# Patient Record
Sex: Female | Born: 1995 | Race: White | Hispanic: No | Marital: Single | Smoking: Never smoker
Health system: Southern US, Community
[De-identification: ages and names within clinical notes are randomized; demographics above are authoritative.]

---
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
## 2016-01-17 IMAGING — CR DG HAND COMPLETE 3+V*R*
3 series · 3 of 3 positions shown · non-contrast
Comparison: None.

CLINICAL DATA: Punched wall with posterior hand pain.

EXAM:
RIGHT HAND - COMPLETE 3+ VIEW

[x hand pa right]
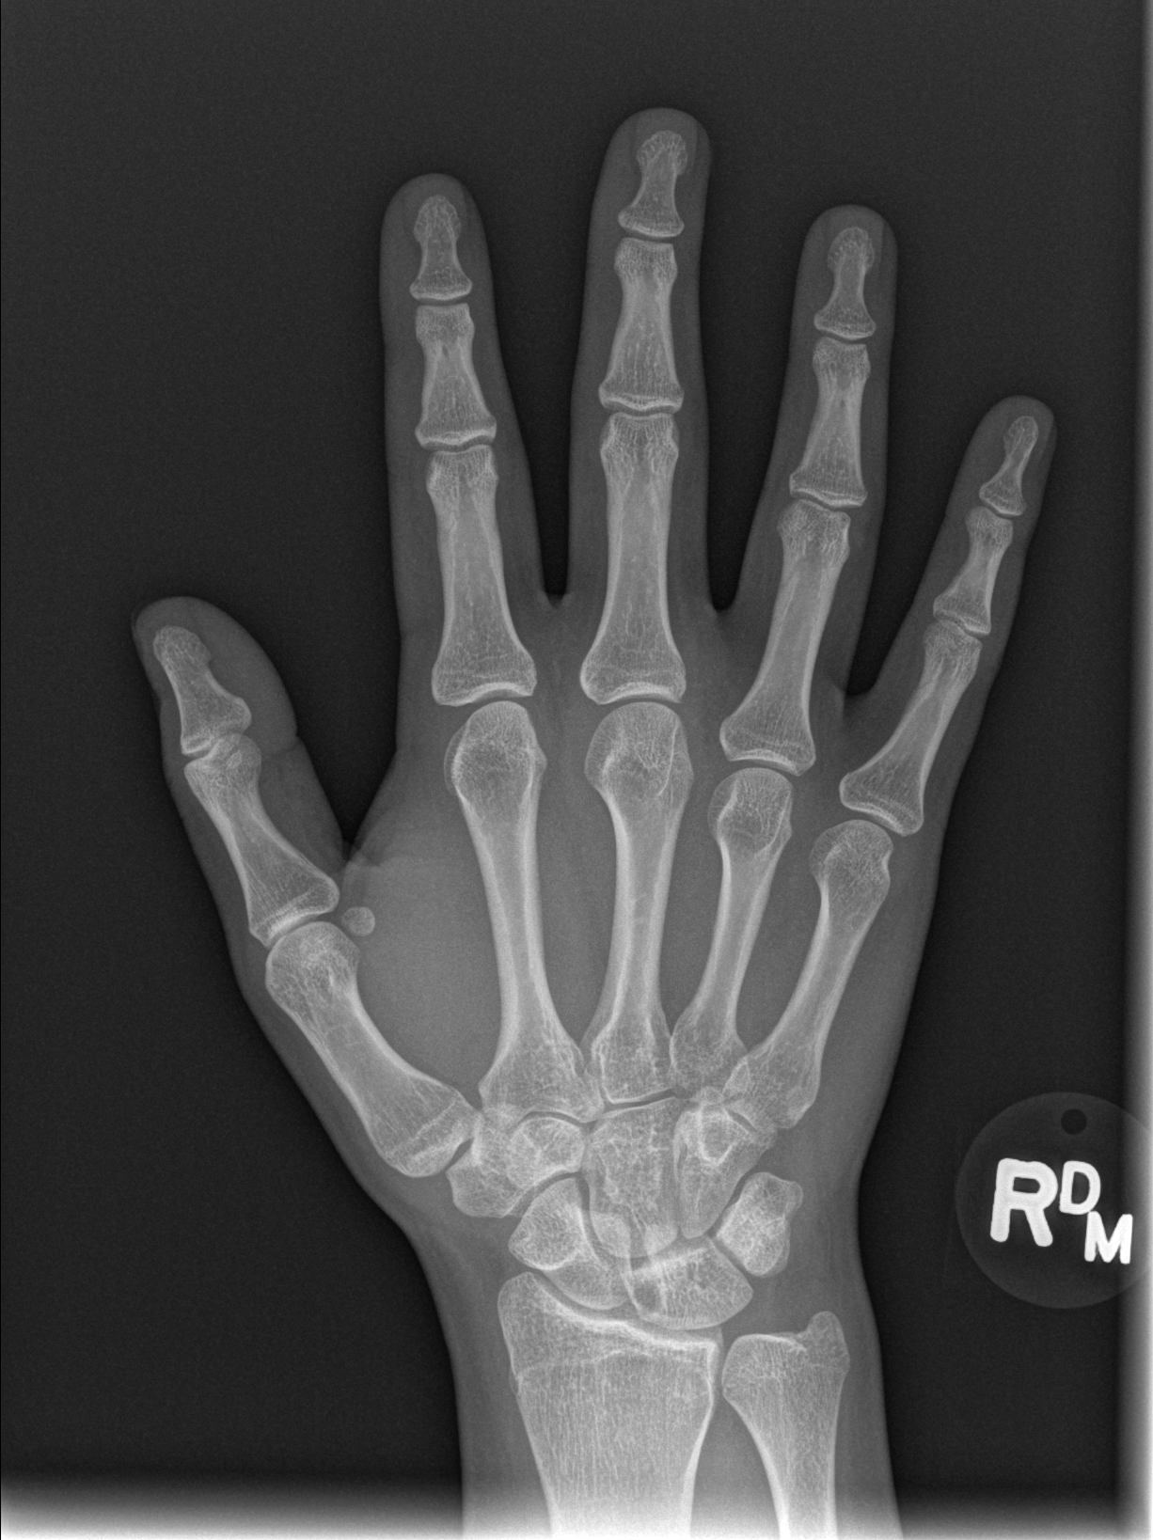

[x hand oblique right]
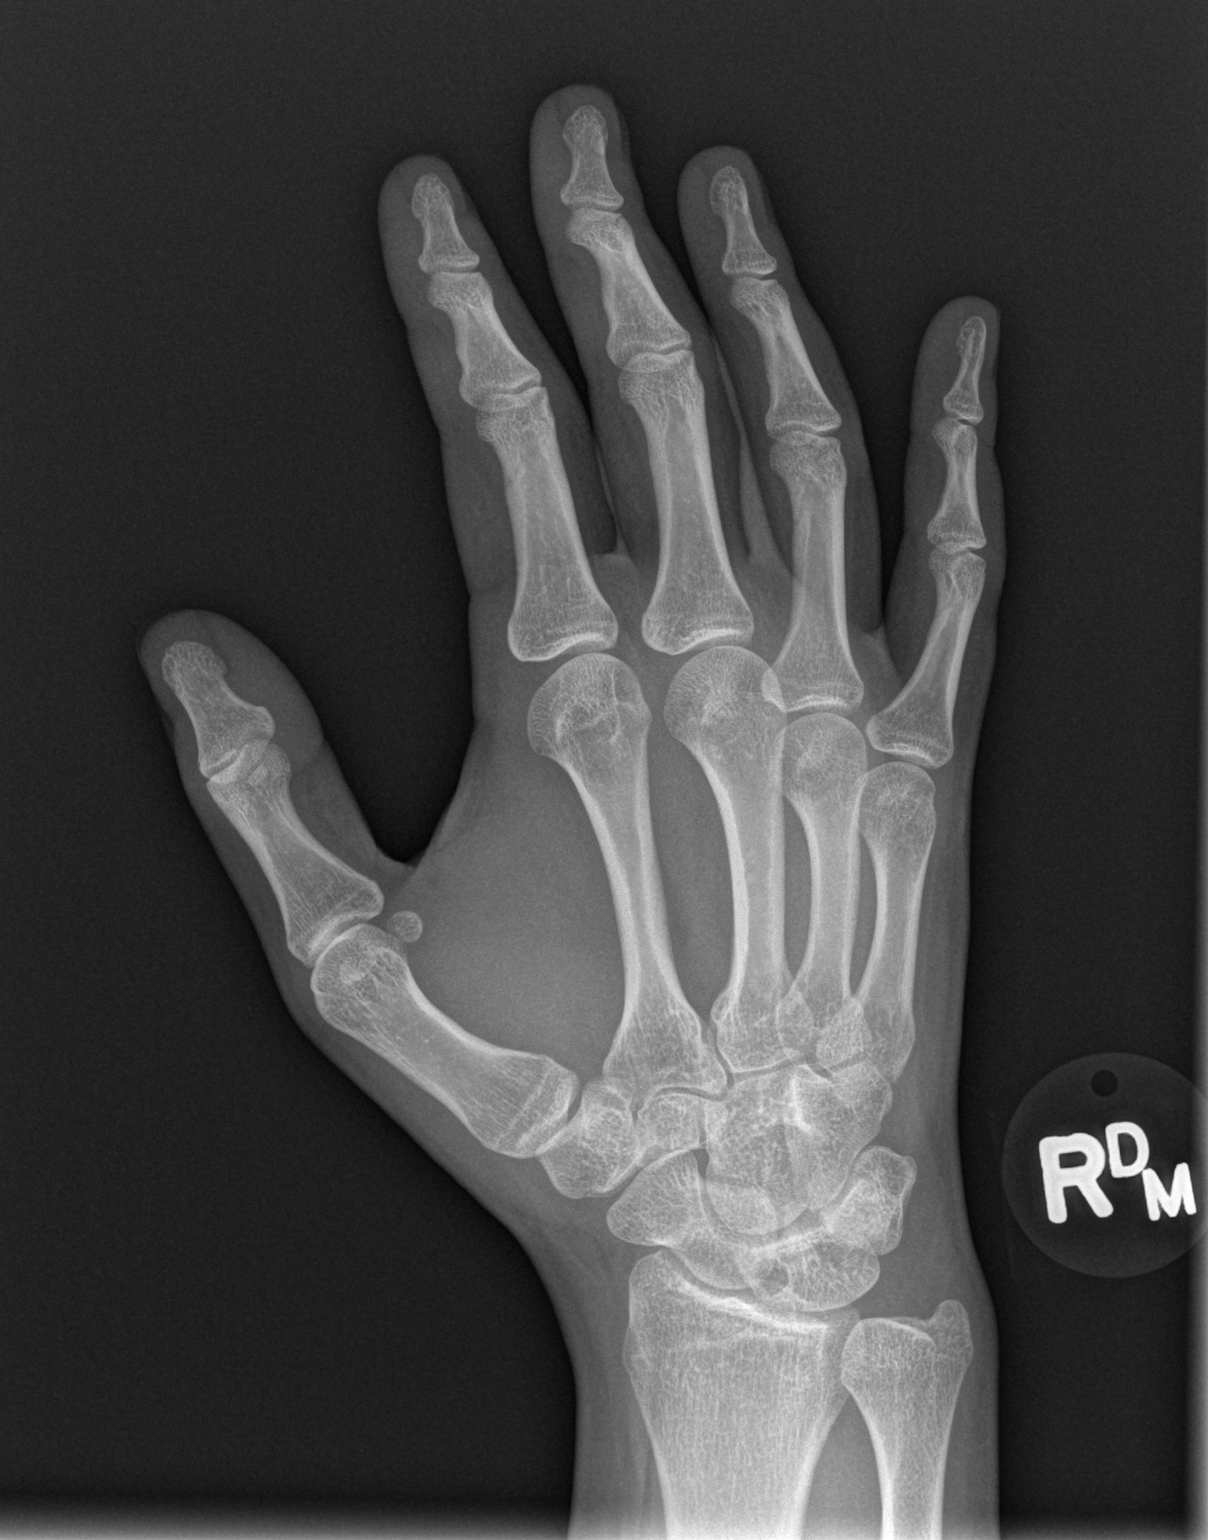

[x hand lat right]
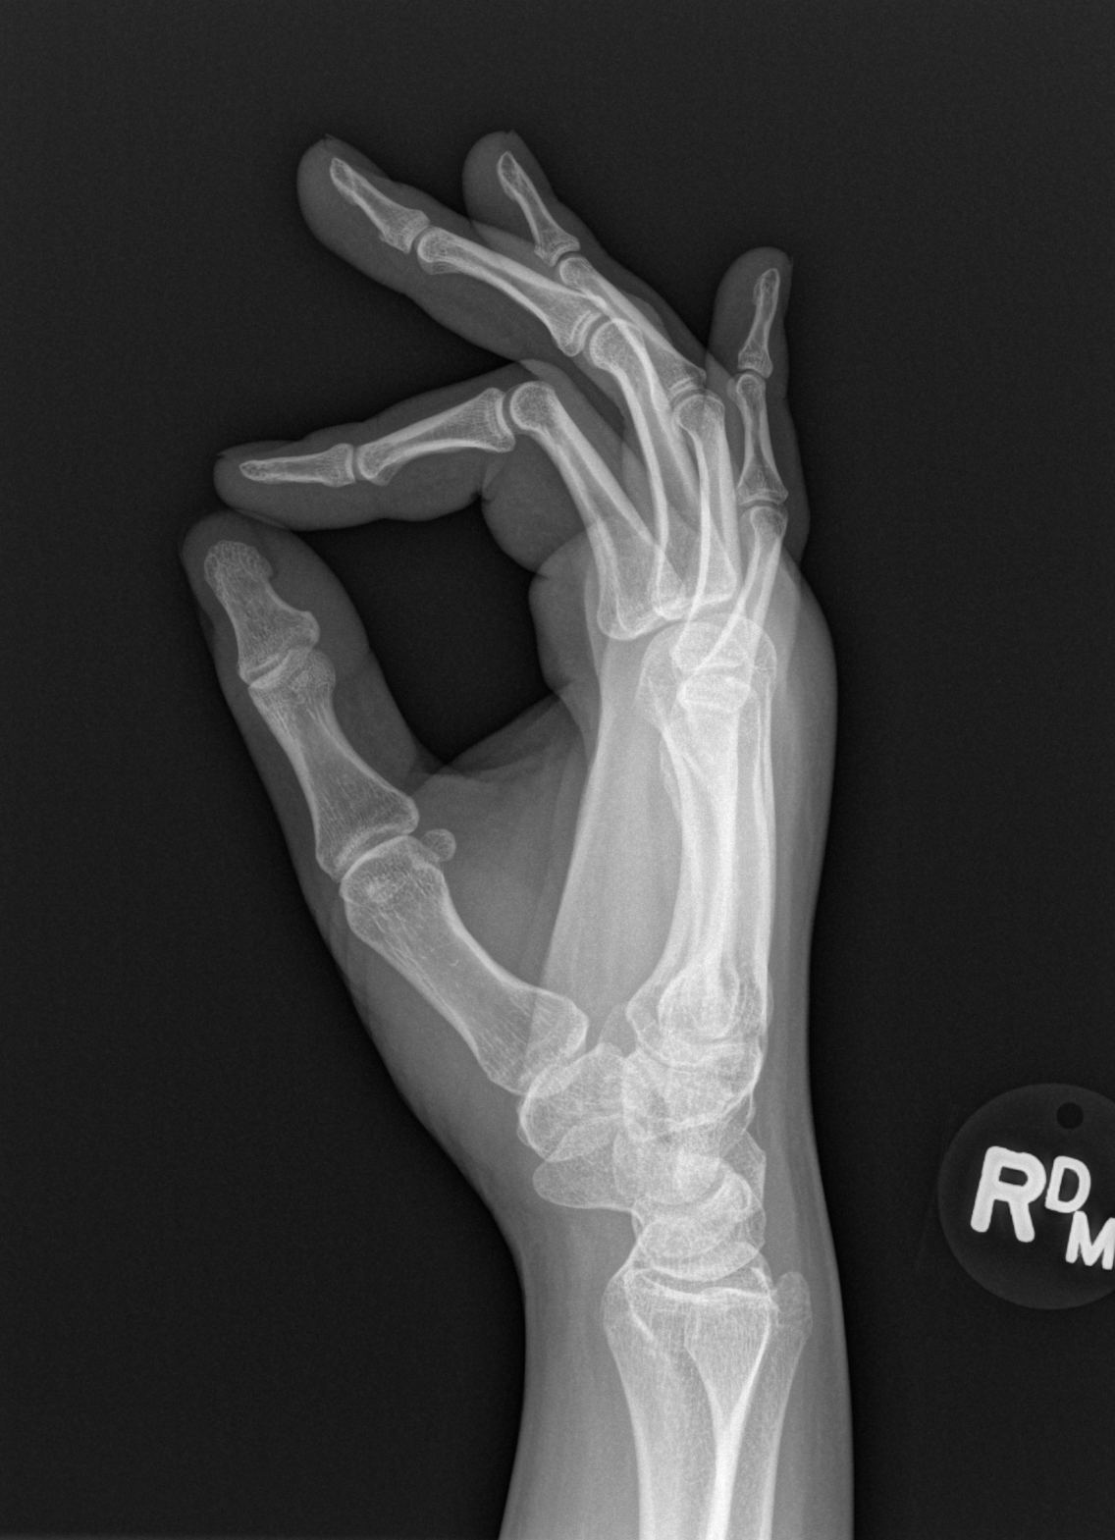

[3 of 3 positions shown; findings below may reference images not displayed]

FINDINGS: Negative for a fracture or dislocation. Alignment of the right hand
is normal. A round lucency in the lunate may represent a subchondral
cyst.
IMPRESSION: No acute bone abnormality in the right hand.
